# Patient Record
Sex: Female | Born: 1970 | Race: White | Hispanic: No | Marital: Married | Smoking: Never smoker
Health system: Southern US, Community
[De-identification: ages and names within clinical notes are randomized; demographics above are authoritative.]

## PROBLEM LIST (undated history)

## (undated) DIAGNOSIS — S069XAA Unspecified intracranial injury with loss of consciousness status unknown, initial encounter: Secondary | ICD-10-CM

## (undated) DIAGNOSIS — S069X9A Unspecified intracranial injury with loss of consciousness of unspecified duration, initial encounter: Secondary | ICD-10-CM

---

## 1994-06-26 HISTORY — PX: VENTRICULOPERITONEAL SHUNT: SHX204

## 2019-04-18 ENCOUNTER — Emergency Department: Payer: BC Managed Care – PPO

## 2019-04-18 ENCOUNTER — Emergency Department
Admission: EM | Admit: 2019-04-18 | Discharge: 2019-04-18 | Disposition: A | Discharge status: Home / Self Care | Payer: BC Managed Care – PPO | Attending: Emergency Medicine | Admitting: Emergency Medicine

## 2019-04-18 ENCOUNTER — Encounter: Payer: Self-pay | Admitting: Emergency Medicine

## 2019-04-18 DIAGNOSIS — Y998 Other external cause status: Secondary | ICD-10-CM | POA: Insufficient documentation

## 2019-04-18 DIAGNOSIS — S01511A Laceration without foreign body of lip, initial encounter: Secondary | ICD-10-CM | POA: Insufficient documentation

## 2019-04-18 DIAGNOSIS — W2210XA Striking against or struck by unspecified automobile airbag, initial encounter: Secondary | ICD-10-CM | POA: Insufficient documentation

## 2019-04-18 DIAGNOSIS — Y92410 Unspecified street and highway as the place of occurrence of the external cause: Secondary | ICD-10-CM | POA: Diagnosis not present

## 2019-04-18 DIAGNOSIS — Z79899 Other long term (current) drug therapy: Secondary | ICD-10-CM | POA: Diagnosis not present

## 2019-04-18 DIAGNOSIS — S80211A Abrasion, right knee, initial encounter: Secondary | ICD-10-CM | POA: Insufficient documentation

## 2019-04-18 DIAGNOSIS — Y9389 Activity, other specified: Secondary | ICD-10-CM | POA: Insufficient documentation

## 2019-04-18 HISTORY — DX: Unspecified intracranial injury with loss of consciousness of unspecified duration, initial encounter: S06.9X9A

## 2019-04-18 HISTORY — DX: Unspecified intracranial injury with loss of consciousness status unknown, initial encounter: S06.9XAA

## 2019-04-18 MED ORDER — CYCLOBENZAPRINE HCL 5 MG PO TABS: 5.0000 mg | ORAL_TABLET | Freq: Three times a day (TID) | ORAL | 0 refills | Status: DC | PRN

## 2019-04-18 MED ORDER — CYCLOBENZAPRINE HCL 10 MG PO TABS
10.0000 mg | ORAL_TABLET | Freq: Once | ORAL | Status: AC
  Administered 2019-04-18: 10 mg via ORAL
  Filled 2019-04-18: qty 1

## 2019-04-18 MED ORDER — LIDOCAINE HCL (PF) 1 % IJ SOLN
5.0000 mL | Freq: Once | INTRAMUSCULAR | Status: AC
  Administered 2019-04-18: 5 mL
  Filled 2019-04-18: qty 5

## 2019-04-18 MED ORDER — IBUPROFEN 800 MG PO TABS
800.0000 mg | ORAL_TABLET | Freq: Once | ORAL | Status: AC
  Administered 2019-04-18: 800 mg via ORAL
  Filled 2019-04-18: qty 1

## 2019-04-18 MED ORDER — AMOXICILLIN-POT CLAVULANATE 875-125 MG PO TABS: 1.0000 | ORAL_TABLET | Freq: Two times a day (BID) | ORAL | 0 refills | Status: AC

## 2019-04-18 MED ORDER — AMOXICILLIN-POT CLAVULANATE 875-125 MG PO TABS
1.0000 | ORAL_TABLET | Freq: Once | ORAL | Status: AC
  Administered 2019-04-18: 1 via ORAL
  Filled 2019-04-18: qty 1
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
## 2019-04-18 NOTE — Discharge Instructions (Addendum)
You should keep the wounds clean and dry. Follow-up with your provider for suture removal in 3-5 days.
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
## 2019-04-23 ENCOUNTER — Ambulatory Visit
Admission: EM | Admit: 2019-04-23 | Discharge: 2019-04-23 | Disposition: A | Discharge status: Home / Self Care | Payer: BC Managed Care – PPO | Attending: Family Medicine | Admitting: Family Medicine

## 2019-04-23 ENCOUNTER — Other Ambulatory Visit: Payer: Self-pay

## 2019-04-23 ENCOUNTER — Encounter: Payer: Self-pay | Admitting: Emergency Medicine

## 2019-04-23 DIAGNOSIS — S01511D Laceration without foreign body of lip, subsequent encounter: Secondary | ICD-10-CM

## 2019-04-23 DIAGNOSIS — Z4802 Encounter for removal of sutures: Secondary | ICD-10-CM

## 2019-04-23 NOTE — ED Triage Notes (Signed)
Patient states she is here for suture removal. She states she has 8 sutures on her top lip, 4 sutures on the inside of her bottom lip that will dissolve and 3 on the outer lower lip that need to be removed.
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
## 2019-09-19 ENCOUNTER — Ambulatory Visit: Payer: BC Managed Care – PPO | Attending: Internal Medicine

## 2019-09-19 DIAGNOSIS — Z23 Encounter for immunization: Secondary | ICD-10-CM

## 2019-09-19 NOTE — Progress Notes (Signed)
   Covid-19 Vaccination Clinic  Name:  Cynthia Brandt    MRN: [id number] DOB: 1970/11/20  09/19/2019  [name] was observed post Covid-19 immunization for 15 minutes without incident. She was provided with Vaccine Information Sheet and instruction to access the V-Safe system.   [name] was instructed to call 911 with any severe reactions post vaccine: Marland Kitchen Difficulty breathing  . Swelling of face and throat  . A fast heartbeat  . A bad rash all over body  . Dizziness and weakness   Immunizations Administered    Name Date Dose VIS Date Route   Pfizer COVID-19 Vaccine 09/19/2019 12:35 PM 0.3 mL 06/06/2019 Intramuscular   Manufacturer: [employer], [employer]   Lot: [id number]   NDC: [address]-4

## 2019-09-25 ENCOUNTER — Encounter: Payer: Self-pay | Admitting: Emergency Medicine

## 2019-09-25 ENCOUNTER — Other Ambulatory Visit: Payer: Self-pay

## 2019-09-25 ENCOUNTER — Ambulatory Visit
Admission: EM | Admit: 2019-09-25 | Discharge: 2019-09-25 | Disposition: A | Discharge status: Home / Self Care | Payer: BC Managed Care – PPO | Attending: Family Medicine | Admitting: Family Medicine

## 2019-09-25 DIAGNOSIS — M5442 Lumbago with sciatica, left side: Secondary | ICD-10-CM | POA: Diagnosis not present

## 2019-09-25 MED ORDER — TIZANIDINE HCL 4 MG PO TABS: 4.0000 mg | ORAL_TABLET | Freq: Four times a day (QID) | ORAL | 0 refills | Status: AC | PRN

## 2019-09-25 MED ORDER — MELOXICAM 15 MG PO TABS: 15.0000 mg | ORAL_TABLET | Freq: Every day | ORAL | 0 refills | Status: AC | PRN

## 2019-09-25 NOTE — ED Triage Notes (Signed)
Patient c/o upper left leg pain that started last Friday. She states the area will get numb at times. She denies injury.

## 2019-09-25 NOTE — ED Provider Notes (Signed)
[facility]    CSN: [id number] Arrival date & time: 09/25/19  1134      History   Chief Complaint Chief Complaint  Patient presents with  . Leg Pain    left   HPI   49 year old female presents with the above complaint.  Patient reports left lower back pain and left upper, lateral leg numbness/tingling.  Has been present since Friday.  Has not been improving.  She reports some mild improvement with ibuprofen but this does not last.  No recent fall, trauma, injury.  No known exacerbating factors.  Patient is concerned that she has sciatica.  No other associated symptoms.  No other complaints.  Past Medical History:  Diagnosis Date  . TBI (traumatic brain injury) [facility])    Past Surgical History:  Procedure Laterality Date  . VENTRICULOPERITONEAL SHUNT Right 1996   OB History   No obstetric history on file.    Home Medications    Prior to Admission medications   Medication Sig Start Date End Date Taking? Authorizing Provider  amphetamine-dextroamphetamine (ADDERALL XR) 10 MG 24 hr capsule Take 10 mg by mouth daily.   Yes [provider]  Norethin Ace-Eth Estrad-FE (MIBELAS 24 FE) 1-20 MG-MCG(24) CHEW Chew 1 tablet by mouth 1 day or 1 dose.   Yes [provider]  meloxicam (MOBIC) 15 MG tablet Take 1 tablet (15 mg total) by mouth daily as needed for pain. 09/25/19   Tommie Sams, DO  tiZANidine (ZANAFLEX) 4 MG tablet Take 1 tablet (4 mg total) by mouth every 6 (six) hours as needed for muscle spasms. 09/25/19   Tommie Sams, DO    Family History History reviewed. No pertinent family history.  Social History Social History   Tobacco Use  . Smoking status: Never Smoker  . Smokeless tobacco: Never Used  Substance Use Topics  . Alcohol use: Not Currently  . Drug use: Never     Allergies   Patient has no known allergies.   Review of Systems Review of Systems  Constitutional: Negative.   Musculoskeletal: Positive for back pain.    Neurological:       Numbness/paresthesia, left upper leg.   Physical Exam Triage Vital Signs ED Triage Vitals  Enc Vitals Group     BP 09/25/19 1201 133/88     Pulse Rate 09/25/19 1201 94     Resp 09/25/19 1201 18     Temp 09/25/19 1201 97.9 F (36.6 C)     Temp Source 09/25/19 1201 Oral     SpO2 09/25/19 1201 100 %     Weight 09/25/19 1200 130 lb (59 kg)     Height 09/25/19 1200 5\' 2"  (1.575 m)     Head Circumference --      Peak Flow --      Pain Score 09/25/19 1159 7     Pain Loc --      Pain Edu? --      Excl. in GC? --    Updated Vital Signs BP 133/88 (BP Location: Right Arm)   Pulse 94   Temp 97.9 F (36.6 C) (Oral)   Resp 18   Ht 5\' 2"  (1.575 m)   Wt 59 kg   SpO2 100%   BMI 23.78 kg/m   Visual Acuity Right Eye Distance:   Left Eye Distance:   Bilateral Distance:    Right Eye Near:   Left Eye Near:    Bilateral Near:     Physical Exam  Vitals and nursing note reviewed.  Constitutional:      General: She is not in acute distress.    Appearance: Normal appearance. She is not ill-appearing.  HENT:     Head: Normocephalic and atraumatic.  Eyes:     General:        Right eye: No discharge.        Left eye: No discharge.     Conjunctiva/sclera: Conjunctivae normal.  Cardiovascular:     Rate and Rhythm: Normal rate and regular rhythm.     Heart sounds: No murmur.  Pulmonary:     Effort: Pulmonary effort is normal.     Breath sounds: Normal breath sounds. No wheezing, rhonchi or rales.  Musculoskeletal:     Comments: Lumbar spine -no tenderness to palpation.  Negative straight leg raise.  Neurological:     Mental Status: She is alert.  Psychiatric:        Mood and Affect: Mood normal.        Behavior: Behavior normal.    UC Treatments / Results  Labs (all labs ordered are listed, but only abnormal results are displayed) Labs Reviewed - No data to display  EKG   Radiology No results found.  Procedures Procedures (including critical care  time)  Medications Ordered in UC Medications - No data to display  Initial Impression / Assessment and Plan / UC Course  I have reviewed the triage vital signs and the nursing notes.  Pertinent labs & imaging results that were available during my care of the patient were reviewed by me and considered in my medical decision making (see chart for details).    49 year old female presents with low back pain with radicular symptoms.  Meloxicam and Zanaflex.  Advised to follow-up with primary care physician and/or neurosurgery if persist.  Final Clinical Impressions(s) / UC Diagnoses   Final diagnoses:  Acute left-sided low back pain with left-sided sciatica     Discharge Instructions     Rest.  Medication as prescribed.   Take care  Dr. Lacinda Axon    ED Prescriptions    Medication Sig Dispense Auth. Provider   tiZANidine (ZANAFLEX) 4 MG tablet Take 1 tablet (4 mg total) by mouth every 6 (six) hours as needed for muscle spasms. 30 tablet Evelyn Moch G, DO   meloxicam (MOBIC) 15 MG tablet Take 1 tablet (15 mg total) by mouth daily as needed for pain. 30 tablet Coral Spikes, DO     PDMP not reviewed this encounter.   Coral Spikes, [place] 09/25/19 1348

## 2019-09-25 NOTE — Discharge Instructions (Signed)
Rest  Medication as prescribed.  Take care  Dr. Dodge Ator  

## 2019-10-14 ENCOUNTER — Ambulatory Visit: Payer: BC Managed Care – PPO | Attending: Internal Medicine

## 2019-10-14 DIAGNOSIS — Z23 Encounter for immunization: Secondary | ICD-10-CM

## 2019-10-14 NOTE — Progress Notes (Signed)
   Covid-19 Vaccination Clinic  Name:  Cynthia Brandt    MRN: [id number] DOB: December 14, 1970  10/14/2019  [name] was observed post Covid-19 immunization for 15 minutes without incident. She was provided with Vaccine Information Sheet and instruction to access the V-Safe system.   [name] was instructed to call 911 with any severe reactions post vaccine: Marland Kitchen Difficulty breathing  . Swelling of face and throat  . A fast heartbeat  . A bad rash all over body  . Dizziness and weakness   Immunizations Administered    Name Date Dose VIS Date Route   Pfizer COVID-19 Vaccine 10/14/2019 10:35 AM 0.3 mL 08/20/2018 Intramuscular   Manufacturer: [employer], [employer]   Lot: [id number]   NDC: [address]-7

## 2019-11-01 ENCOUNTER — Other Ambulatory Visit: Payer: Self-pay | Admitting: Family Medicine

## 2021-03-28 ENCOUNTER — Other Ambulatory Visit: Payer: Self-pay | Admitting: Nurse Practitioner

## 2021-03-28 DIAGNOSIS — Z1231 Encounter for screening mammogram for malignant neoplasm of breast: Secondary | ICD-10-CM

## 2021-04-25 ENCOUNTER — Ambulatory Visit
Admission: RE | Admit: 2021-04-25 | Discharge: 2021-04-25 | Disposition: A | Discharge status: Home / Self Care | Payer: No Typology Code available for payment source | Source: Ambulatory Visit | Attending: Nurse Practitioner | Admitting: Nurse Practitioner

## 2021-04-25 ENCOUNTER — Other Ambulatory Visit: Payer: Self-pay

## 2021-04-25 DIAGNOSIS — Z1231 Encounter for screening mammogram for malignant neoplasm of breast: Secondary | ICD-10-CM | POA: Diagnosis not present

## 2021-05-02 ENCOUNTER — Inpatient Hospital Stay
Admission: RE | Admit: 2021-05-02 | Discharge: 2021-05-02 | Disposition: A | Discharge status: Home / Self Care | Payer: Self-pay | Source: Ambulatory Visit | Attending: *Deleted | Admitting: *Deleted

## 2021-05-02 ENCOUNTER — Other Ambulatory Visit: Payer: Self-pay | Admitting: *Deleted

## 2021-05-02 DIAGNOSIS — Z1231 Encounter for screening mammogram for malignant neoplasm of breast: Secondary | ICD-10-CM

## 2022-04-10 ENCOUNTER — Other Ambulatory Visit: Payer: Self-pay | Admitting: Nurse Practitioner

## 2022-04-10 DIAGNOSIS — Z1231 Encounter for screening mammogram for malignant neoplasm of breast: Secondary | ICD-10-CM

## 2022-05-03 ENCOUNTER — Ambulatory Visit
Admission: RE | Admit: 2022-05-03 | Discharge: 2022-05-03 | Disposition: A | Discharge status: Home / Self Care | Payer: No Typology Code available for payment source | Source: Ambulatory Visit | Attending: Nurse Practitioner | Admitting: Nurse Practitioner

## 2022-05-03 DIAGNOSIS — Z1231 Encounter for screening mammogram for malignant neoplasm of breast: Secondary | ICD-10-CM | POA: Diagnosis present

## 2022-06-30 IMAGING — MG MM DIGITAL SCREENING BILAT W/ TOMO AND CAD
6 of 12 series · 6 of 36 positions shown · non-contrast
Comparison: Previous exam(s).

CLINICAL DATA: Screening.

EXAM:
DIGITAL SCREENING BILATERAL MAMMOGRAM WITH TOMOSYNTHESIS AND CAD
TECHNIQUE: Bilateral screening digital craniocaudal and mediolateral oblique
mammograms were obtained. Bilateral screening digital breast
tomosynthesis was performed. The images were evaluated with
computer-aided detection.

[L MLO synth-2D]
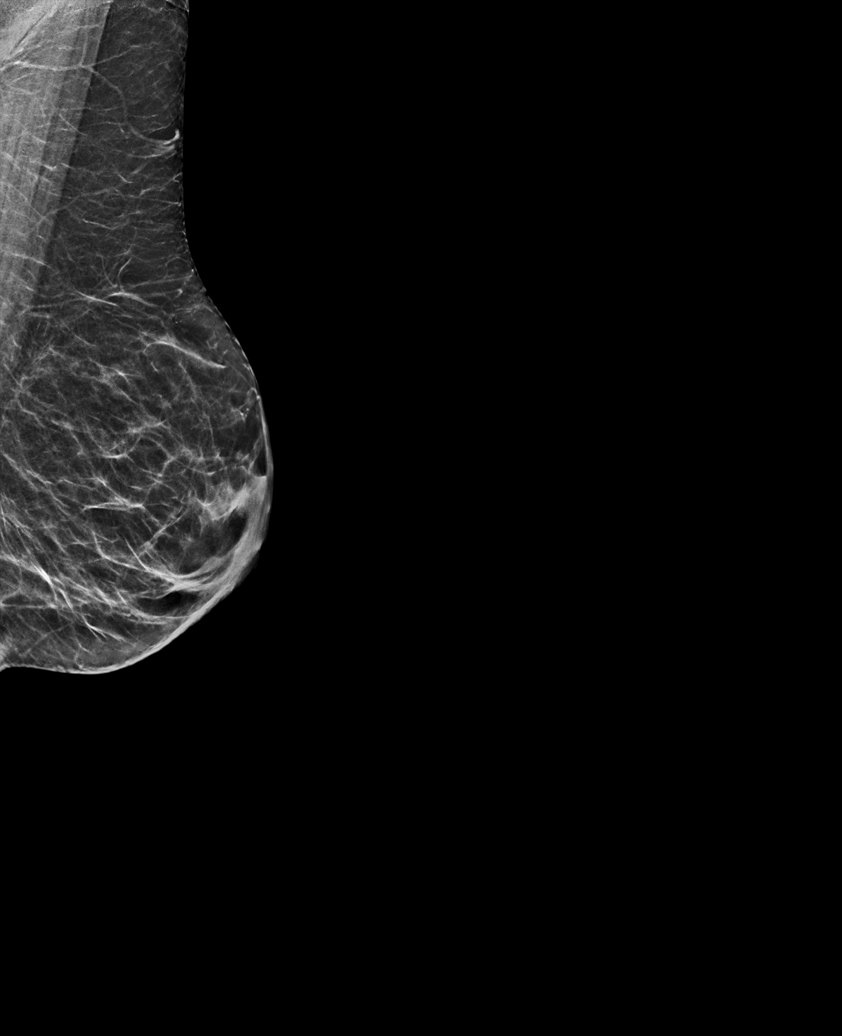

[R MLO synth-2D (1 of 3)]
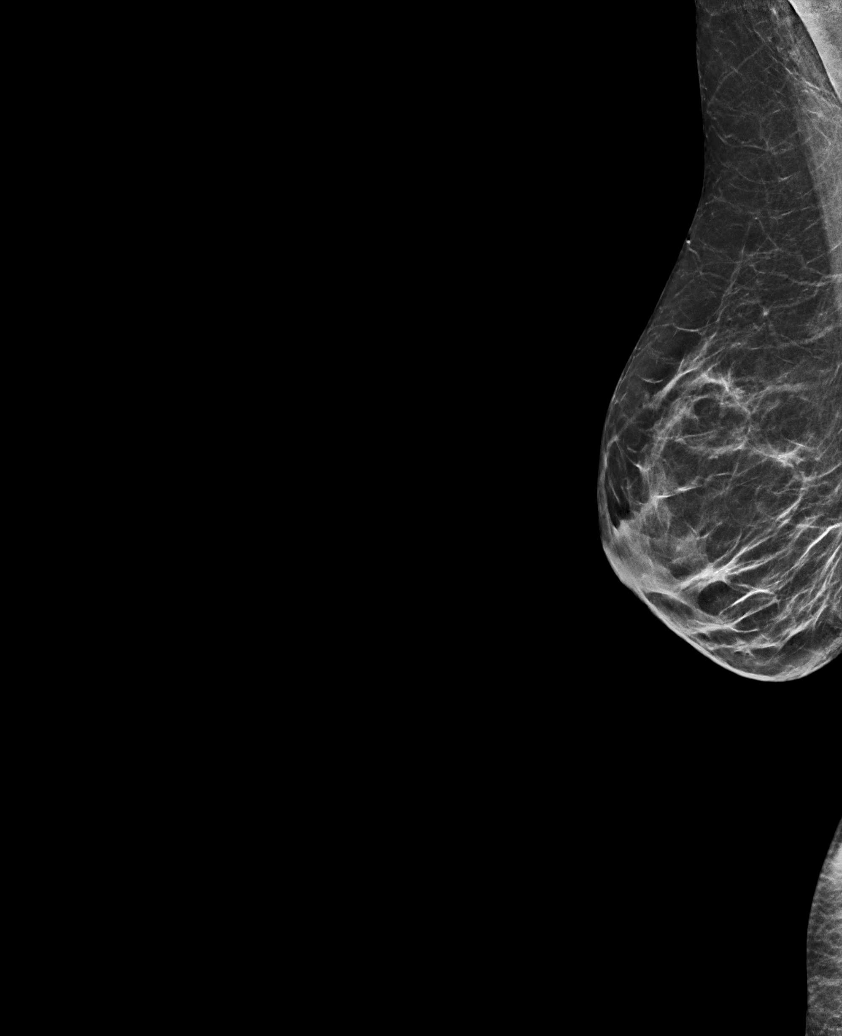

[R MLO synth-2D (2 of 3)]
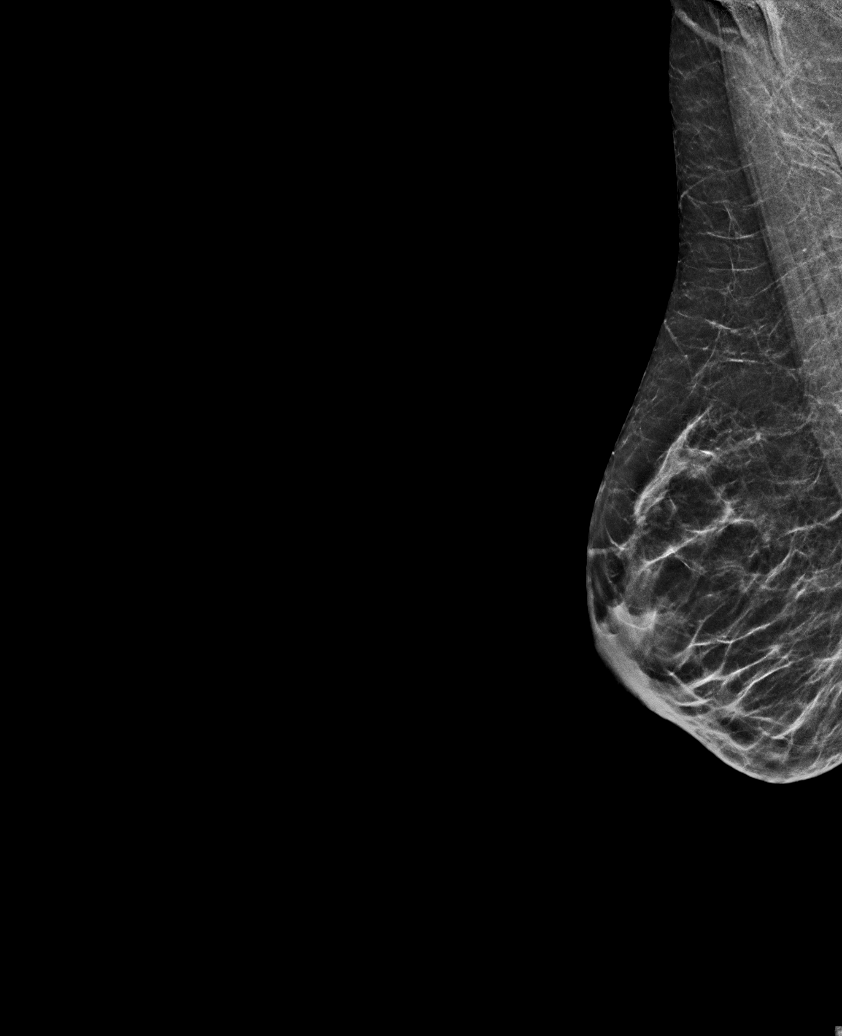

[R CC synth-2D]
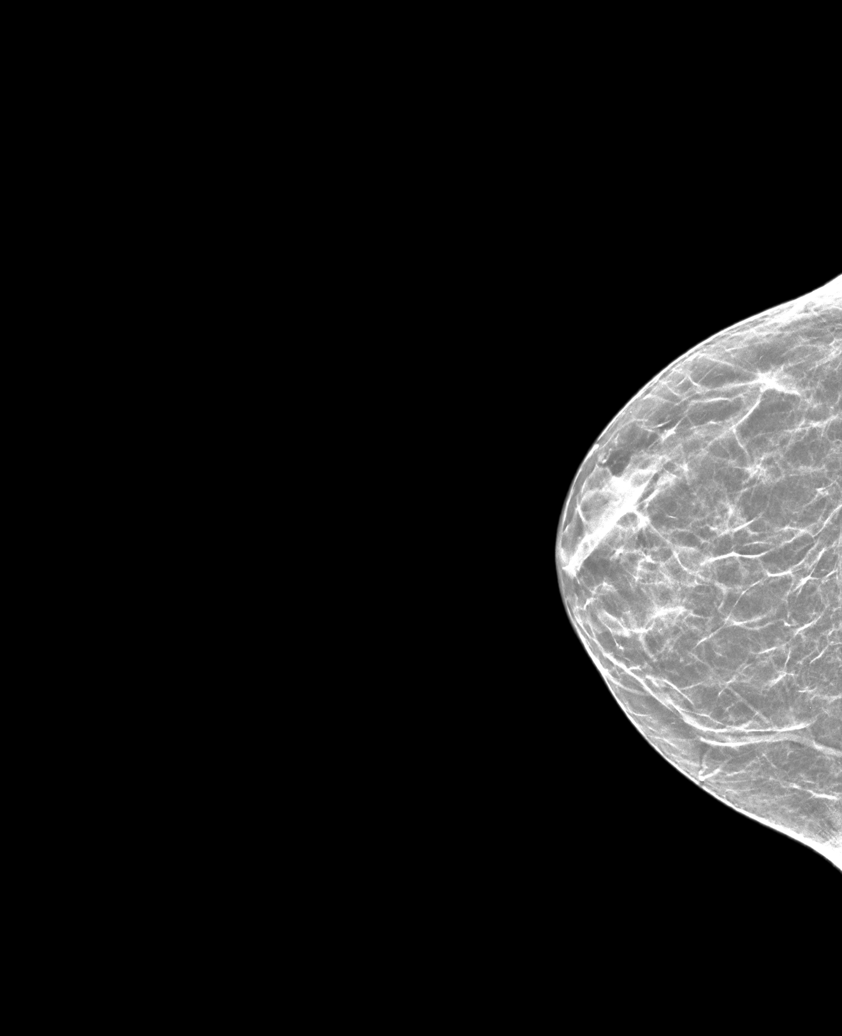

[R MLO synth-2D (3 of 3)]
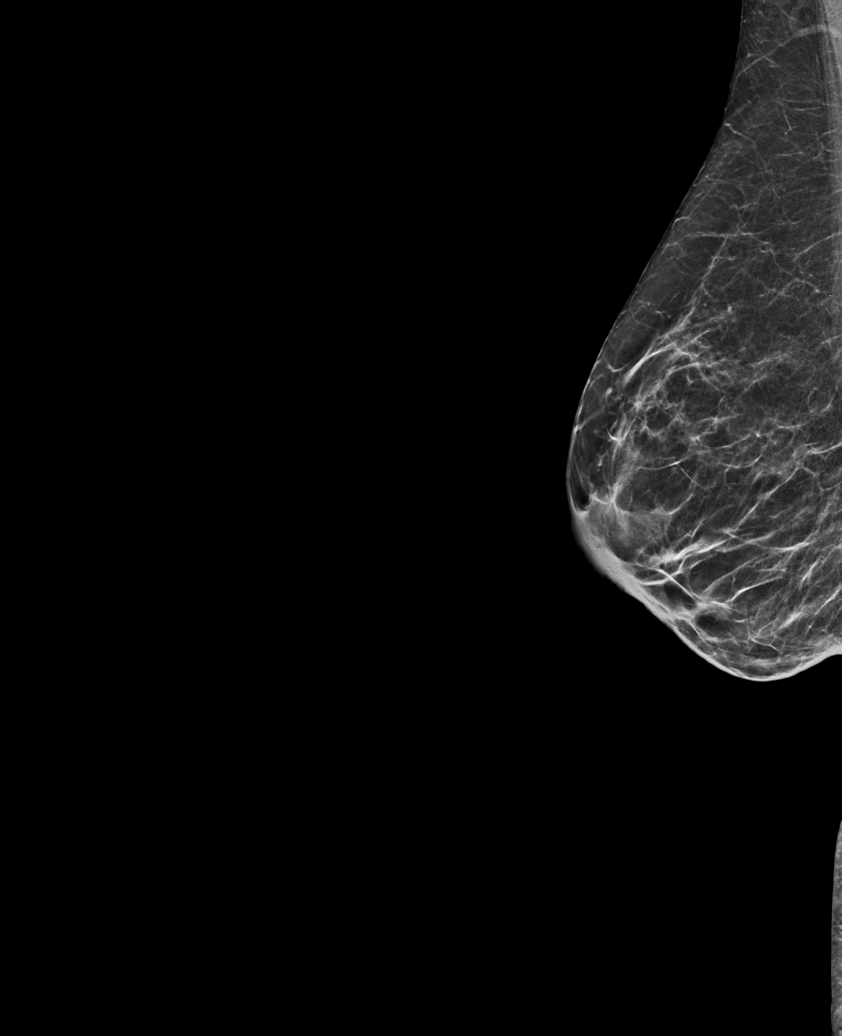

[L CC synth-2D]
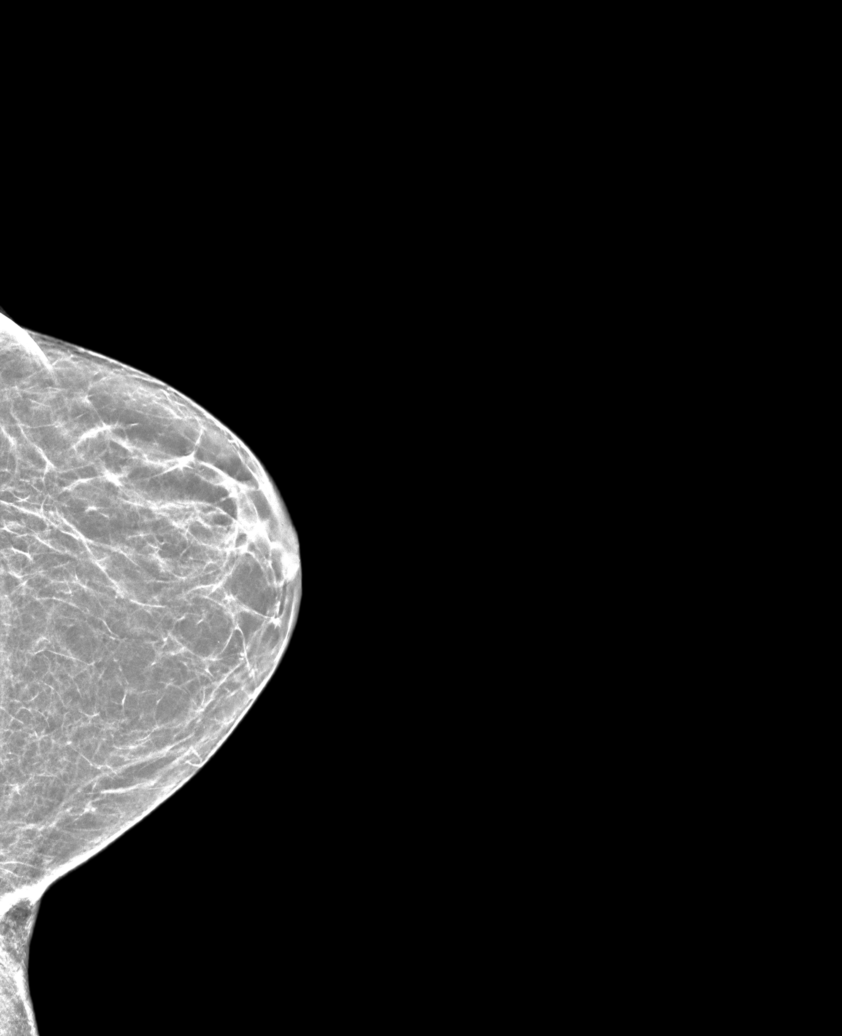

[6 of 36 positions shown; findings below may reference images not displayed]

ACR Breast Density Category b: There are scattered areas of
fibroglandular density.
FINDINGS: There are no findings suspicious for malignancy.
IMPRESSION: No mammographic evidence of malignancy. A result letter of this
screening mammogram will be mailed directly to the patient.

RECOMMENDATION:
Screening mammogram in one year. (Code:51-O-LD2)

BI-RADS CATEGORY  1: Negative.

## 2023-06-06 ENCOUNTER — Other Ambulatory Visit: Payer: Self-pay | Admitting: Nurse Practitioner

## 2023-06-06 DIAGNOSIS — Z1231 Encounter for screening mammogram for malignant neoplasm of breast: Secondary | ICD-10-CM

## 2023-06-28 ENCOUNTER — Ambulatory Visit
Admission: RE | Admit: 2023-06-28 | Discharge: 2023-06-28 | Disposition: A | Discharge status: Home / Self Care | Payer: No Typology Code available for payment source | Source: Ambulatory Visit | Attending: Nurse Practitioner | Admitting: Nurse Practitioner

## 2023-06-28 DIAGNOSIS — Z1231 Encounter for screening mammogram for malignant neoplasm of breast: Secondary | ICD-10-CM | POA: Diagnosis present

## 2024-06-16 ENCOUNTER — Other Ambulatory Visit: Payer: Self-pay | Admitting: Nurse Practitioner

## 2024-06-16 DIAGNOSIS — Z1231 Encounter for screening mammogram for malignant neoplasm of breast: Secondary | ICD-10-CM

## 2024-07-21 ENCOUNTER — Ambulatory Visit

## 2024-08-13 ENCOUNTER — Ambulatory Visit
